# Patient Record
Sex: Female | Born: 1960 | Race: White | Hispanic: No | Marital: Married | Smoking: Never smoker
Health system: Southern US, Community
[De-identification: ages and names within clinical notes are randomized; demographics above are authoritative.]

---

## 2005-11-02 ENCOUNTER — Other Ambulatory Visit: Admission: RE | Admit: 2005-11-02 | Discharge: 2005-11-02 | Payer: Self-pay | Admitting: Family Medicine

## 2006-03-14 ENCOUNTER — Ambulatory Visit (HOSPITAL_COMMUNITY): Admission: RE | Admit: 2006-03-14 | Discharge: 2006-03-14 | Payer: Self-pay | Admitting: Family Medicine

## 2007-01-04 ENCOUNTER — Other Ambulatory Visit: Admission: RE | Admit: 2007-01-04 | Discharge: 2007-01-04 | Payer: Self-pay | Admitting: Family Medicine

## 2007-05-11 ENCOUNTER — Ambulatory Visit (HOSPITAL_COMMUNITY): Admission: RE | Admit: 2007-05-11 | Discharge: 2007-05-11 | Payer: Self-pay | Admitting: Family Medicine

## 2008-05-10 ENCOUNTER — Other Ambulatory Visit: Admission: RE | Admit: 2008-05-10 | Discharge: 2008-05-10 | Payer: Self-pay | Admitting: Family Medicine

## 2008-06-03 ENCOUNTER — Ambulatory Visit (HOSPITAL_COMMUNITY): Admission: RE | Admit: 2008-06-03 | Discharge: 2008-06-03 | Payer: Self-pay | Admitting: Family Medicine

## 2009-07-21 ENCOUNTER — Ambulatory Visit (HOSPITAL_COMMUNITY): Admission: RE | Admit: 2009-07-21 | Discharge: 2009-07-21 | Payer: Self-pay | Admitting: Family Medicine

## 2009-09-05 ENCOUNTER — Other Ambulatory Visit: Admission: RE | Admit: 2009-09-05 | Discharge: 2009-09-05 | Payer: Self-pay | Admitting: Family Medicine

## 2010-07-23 ENCOUNTER — Ambulatory Visit (HOSPITAL_COMMUNITY): Admission: RE | Admit: 2010-07-23 | Discharge: 2010-07-23 | Payer: Self-pay | Admitting: Family Medicine

## 2010-10-02 ENCOUNTER — Other Ambulatory Visit: Admission: RE | Admit: 2010-10-02 | Discharge: 2010-10-02 | Payer: Self-pay | Admitting: Family Medicine

## 2011-07-21 ENCOUNTER — Other Ambulatory Visit (HOSPITAL_COMMUNITY): Payer: Self-pay | Admitting: Family Medicine

## 2011-07-21 DIAGNOSIS — Z1231 Encounter for screening mammogram for malignant neoplasm of breast: Secondary | ICD-10-CM

## 2011-08-03 ENCOUNTER — Ambulatory Visit (HOSPITAL_COMMUNITY)
Admission: RE | Admit: 2011-08-03 | Discharge: 2011-08-03 | Disposition: A | Discharge status: Home / Self Care | Payer: 59 | Source: Ambulatory Visit | Attending: Family Medicine | Admitting: Family Medicine

## 2011-08-03 DIAGNOSIS — Z1231 Encounter for screening mammogram for malignant neoplasm of breast: Secondary | ICD-10-CM

## 2011-08-09 ENCOUNTER — Other Ambulatory Visit: Payer: Self-pay | Admitting: Family Medicine

## 2011-08-09 DIAGNOSIS — R928 Other abnormal and inconclusive findings on diagnostic imaging of breast: Secondary | ICD-10-CM

## 2011-08-16 ENCOUNTER — Other Ambulatory Visit: Payer: Self-pay | Admitting: Family Medicine

## 2011-08-16 ENCOUNTER — Ambulatory Visit
Admission: RE | Admit: 2011-08-16 | Discharge: 2011-08-16 | Disposition: A | Discharge status: Home / Self Care | Payer: 59 | Source: Ambulatory Visit | Attending: Family Medicine | Admitting: Family Medicine

## 2011-08-16 DIAGNOSIS — R928 Other abnormal and inconclusive findings on diagnostic imaging of breast: Secondary | ICD-10-CM

## 2011-10-08 ENCOUNTER — Other Ambulatory Visit: Payer: Self-pay | Admitting: Family Medicine

## 2011-10-08 ENCOUNTER — Other Ambulatory Visit (HOSPITAL_COMMUNITY)
Admission: RE | Admit: 2011-10-08 | Discharge: 2011-10-08 | Disposition: A | Discharge status: Home / Self Care | Payer: 59 | Source: Ambulatory Visit | Attending: Family Medicine | Admitting: Family Medicine

## 2011-10-08 DIAGNOSIS — Z124 Encounter for screening for malignant neoplasm of cervix: Secondary | ICD-10-CM | POA: Insufficient documentation

## 2012-12-06 ENCOUNTER — Other Ambulatory Visit (HOSPITAL_COMMUNITY): Payer: Self-pay | Admitting: Family Medicine

## 2012-12-06 DIAGNOSIS — Z1231 Encounter for screening mammogram for malignant neoplasm of breast: Secondary | ICD-10-CM

## 2012-12-07 ENCOUNTER — Ambulatory Visit (HOSPITAL_COMMUNITY)
Admission: RE | Admit: 2012-12-07 | Discharge: 2012-12-07 | Disposition: A | Discharge status: Home / Self Care | Payer: 59 | Source: Ambulatory Visit | Attending: Family Medicine | Admitting: Family Medicine

## 2012-12-07 DIAGNOSIS — Z1231 Encounter for screening mammogram for malignant neoplasm of breast: Secondary | ICD-10-CM

## 2013-07-13 ENCOUNTER — Encounter: Payer: Self-pay | Admitting: Family Medicine

## 2013-07-13 ENCOUNTER — Ambulatory Visit (INDEPENDENT_AMBULATORY_CARE_PROVIDER_SITE_OTHER): Payer: 59 | Admitting: Family Medicine

## 2013-07-13 VITALS — BP 137/79 | HR 78 | Ht 67.0 in | Wt 160.3 lb

## 2013-07-13 DIAGNOSIS — M545 Low back pain, unspecified: Secondary | ICD-10-CM

## 2013-07-13 NOTE — Patient Instructions (Addendum)
You have a lumbar strain. Take tylenol for baseline pain relief (1-2 extra strength tabs 3x/day) Aleve 2 tabs twice a day with food OR ibuprofen 600mg  three times a day with food for pain and inflammation for next 1-2 weeks then as needed. Consider Tramadol or vicodin as needed for severe pain (no driving on this medicine). Consider flexeril as needed for muscle spasms (no driving on this medicine). Stay as active as possible. Do home exercises and stretches as directed - pick 3-5 of these exercises and do every day for next 4-6 weeks. Consider physical therapy if not improving - call me if you want to do this. Strengthening of low back muscles, abdominal musculature are key for long term pain relief. If not improving, will consider physical therapy or further imaging (x-rays, MRI). Follow up with me in 1 month or as needed.

## 2013-07-18 ENCOUNTER — Encounter: Payer: Self-pay | Admitting: Family Medicine

## 2013-07-18 DIAGNOSIS — M545 Low back pain, unspecified: Secondary | ICD-10-CM | POA: Insufficient documentation

## 2013-07-18 NOTE — Progress Notes (Signed)
Patient ID: Kristina Maddox, female   DOB: April 30, 1961, 52 y.o.   MRN: [id number]  PCP: No primary provider on file.  Subjective:   HPI: Patient is a 52 y.o. female here for low back pain.  Patient reports on 8/20 she was lifting an umbrella stand for work and turned quickly, felt a pop on left side of back. Able to keep working through this though pain intensified throughout the day. Pain worse with certain movements (leaning back, getting out of car). Hard to get out of bed in the morning. Has been icing, using massage chair, aleve. No numbness/tingling. No bowel/bladder dysfunction. No prior injuries.  History reviewed. No pertinent past medical history.  No current outpatient prescriptions on file prior to visit.   No current facility-administered medications on file prior to visit.    Past Surgical History  Procedure Laterality Date  . Cesarean section      Allergies  Allergen Reactions  . Ampicillin     History   Social History  . Marital Status: Married    Spouse Name: N/A    Number of Children: N/A  . Years of Education: N/A   Occupational History  . Not on file.   Social History Main Topics  . Smoking status: Never Smoker   . Smokeless tobacco: Not on file  . Alcohol Use: Not on file  . Drug Use: Not on file  . Sexual Activity: Not on file   Other Topics Concern  . Not on file   Social History Narrative  . No narrative on file    Family History  Problem Relation Age of Onset  . Hypertension Father   . Sudden death Neg Hx   . Hyperlipidemia Neg Hx   . Heart attack Neg Hx   . Diabetes Neg Hx     BP 137/79  Pulse 78  Ht 5\' 7"  (1.702 m)  Wt 160 lb 4.8 oz (72.712 kg)  BMI 25.1 kg/m2  Review of Systems: See HPI above.    Objective:  Physical Exam:  Gen: NAD  Back: No gross deformity, scoliosis. TTP left lumbar paraspinal region mildly.  No midline or bony TTP. FROM with pain on extension > flexion. Strength LEs 5/5 all muscle  groups.   2+ MSRs in patellar and achilles tendons, equal bilaterally. Negative SLRs. Sensation intact to light touch bilaterally. Negative logroll bilateral hips Negative fabers and piriformis stretches.    Assessment & Plan:  1. Low back pain - consistent with lumbar strain, less likely disc herniation without radiculopathy.  Start with tylenol, nsaids, home exercise program.  Declined formal PT - consider if not improving.  F/u in 1 month or prn.

## 2013-07-18 NOTE — Assessment & Plan Note (Signed)
consistent with lumbar strain, less likely disc herniation without radiculopathy.  Start with tylenol, nsaids, home exercise program.  Declined formal PT - consider if not improving.  F/u in 1 month or prn.

## 2014-01-21 ENCOUNTER — Other Ambulatory Visit (HOSPITAL_COMMUNITY): Payer: Self-pay | Admitting: Family Medicine

## 2014-01-21 DIAGNOSIS — Z1231 Encounter for screening mammogram for malignant neoplasm of breast: Secondary | ICD-10-CM

## 2014-01-23 ENCOUNTER — Ambulatory Visit (HOSPITAL_COMMUNITY)
Admission: RE | Admit: 2014-01-23 | Discharge: 2014-01-23 | Disposition: A | Discharge status: Home / Self Care | Payer: 59 | Source: Ambulatory Visit | Attending: Family Medicine | Admitting: Family Medicine

## 2014-01-23 DIAGNOSIS — Z1231 Encounter for screening mammogram for malignant neoplasm of breast: Secondary | ICD-10-CM | POA: Insufficient documentation

## 2014-01-29 ENCOUNTER — Other Ambulatory Visit: Payer: Self-pay | Admitting: Family Medicine

## 2014-01-29 ENCOUNTER — Other Ambulatory Visit (HOSPITAL_COMMUNITY)
Admission: RE | Admit: 2014-01-29 | Discharge: 2014-01-29 | Disposition: A | Discharge status: Home / Self Care | Payer: 59 | Source: Ambulatory Visit | Attending: Family Medicine | Admitting: Family Medicine

## 2014-01-29 DIAGNOSIS — Z124 Encounter for screening for malignant neoplasm of cervix: Secondary | ICD-10-CM | POA: Insufficient documentation

## 2014-01-29 DIAGNOSIS — Z1151 Encounter for screening for human papillomavirus (HPV): Secondary | ICD-10-CM | POA: Insufficient documentation

## 2014-02-07 ENCOUNTER — Other Ambulatory Visit: Payer: Self-pay | Admitting: Obstetrics & Gynecology

## 2015-01-02 ENCOUNTER — Other Ambulatory Visit (HOSPITAL_COMMUNITY): Payer: Self-pay | Admitting: Family Medicine

## 2015-01-02 DIAGNOSIS — Z1231 Encounter for screening mammogram for malignant neoplasm of breast: Secondary | ICD-10-CM

## 2015-01-27 ENCOUNTER — Ambulatory Visit (HOSPITAL_COMMUNITY)
Admission: RE | Admit: 2015-01-27 | Discharge: 2015-01-27 | Disposition: A | Discharge status: Home / Self Care | Payer: 59 | Source: Ambulatory Visit | Attending: Family Medicine | Admitting: Family Medicine

## 2015-01-27 DIAGNOSIS — Z1231 Encounter for screening mammogram for malignant neoplasm of breast: Secondary | ICD-10-CM | POA: Insufficient documentation

## 2016-03-15 ENCOUNTER — Other Ambulatory Visit: Payer: Self-pay

## 2016-03-15 DIAGNOSIS — Z1231 Encounter for screening mammogram for malignant neoplasm of breast: Secondary | ICD-10-CM

## 2016-03-26 ENCOUNTER — Ambulatory Visit
Admission: RE | Admit: 2016-03-26 | Discharge: 2016-03-26 | Disposition: A | Discharge status: Home / Self Care | Payer: 59 | Source: Ambulatory Visit

## 2016-03-26 DIAGNOSIS — Z1231 Encounter for screening mammogram for malignant neoplasm of breast: Secondary | ICD-10-CM

## 2017-02-08 DIAGNOSIS — L719 Rosacea, unspecified: Secondary | ICD-10-CM | POA: Diagnosis not present

## 2017-02-08 DIAGNOSIS — D1800 Hemangioma unspecified site: Secondary | ICD-10-CM | POA: Diagnosis not present

## 2017-02-08 DIAGNOSIS — L821 Other seborrheic keratosis: Secondary | ICD-10-CM | POA: Diagnosis not present

## 2017-02-08 DIAGNOSIS — D485 Neoplasm of uncertain behavior of skin: Secondary | ICD-10-CM | POA: Diagnosis not present

## 2017-03-01 DIAGNOSIS — D2239 Melanocytic nevi of other parts of face: Secondary | ICD-10-CM | POA: Diagnosis not present

## 2017-03-01 DIAGNOSIS — D2330 Other benign neoplasm of skin of unspecified part of face: Secondary | ICD-10-CM | POA: Diagnosis not present

## 2017-03-02 ENCOUNTER — Other Ambulatory Visit: Payer: Self-pay | Admitting: Family Medicine

## 2017-03-02 ENCOUNTER — Other Ambulatory Visit (HOSPITAL_COMMUNITY)
Admission: RE | Admit: 2017-03-02 | Discharge: 2017-03-02 | Disposition: A | Discharge status: Home / Self Care | Payer: 59 | Source: Ambulatory Visit | Attending: Family Medicine | Admitting: Family Medicine

## 2017-03-02 DIAGNOSIS — Z1151 Encounter for screening for human papillomavirus (HPV): Secondary | ICD-10-CM | POA: Diagnosis not present

## 2017-03-02 DIAGNOSIS — Z1159 Encounter for screening for other viral diseases: Secondary | ICD-10-CM | POA: Diagnosis not present

## 2017-03-02 DIAGNOSIS — Z01411 Encounter for gynecological examination (general) (routine) with abnormal findings: Secondary | ICD-10-CM | POA: Insufficient documentation

## 2017-03-02 DIAGNOSIS — Z0000 Encounter for general adult medical examination without abnormal findings: Secondary | ICD-10-CM | POA: Diagnosis not present

## 2017-03-04 LAB — CYTOLOGY - PAP
Diagnosis: NEGATIVE
HPV: NOT DETECTED

## 2017-04-21 ENCOUNTER — Other Ambulatory Visit: Payer: Self-pay | Admitting: Family Medicine

## 2017-04-21 DIAGNOSIS — Z1231 Encounter for screening mammogram for malignant neoplasm of breast: Secondary | ICD-10-CM

## 2017-05-03 ENCOUNTER — Ambulatory Visit
Admission: RE | Admit: 2017-05-03 | Discharge: 2017-05-03 | Disposition: A | Discharge status: Home / Self Care | Payer: 59 | Source: Ambulatory Visit | Attending: Family Medicine | Admitting: Family Medicine

## 2017-05-03 DIAGNOSIS — Z1231 Encounter for screening mammogram for malignant neoplasm of breast: Secondary | ICD-10-CM | POA: Diagnosis not present

## 2017-09-14 DIAGNOSIS — E559 Vitamin D deficiency, unspecified: Secondary | ICD-10-CM | POA: Diagnosis not present

## 2017-09-15 DIAGNOSIS — Z23 Encounter for immunization: Secondary | ICD-10-CM | POA: Diagnosis not present

## 2018-02-07 DIAGNOSIS — L814 Other melanin hyperpigmentation: Secondary | ICD-10-CM | POA: Diagnosis not present

## 2018-02-07 DIAGNOSIS — L821 Other seborrheic keratosis: Secondary | ICD-10-CM | POA: Diagnosis not present

## 2018-02-07 DIAGNOSIS — D1800 Hemangioma unspecified site: Secondary | ICD-10-CM | POA: Diagnosis not present

## 2018-03-06 DIAGNOSIS — B308 Other viral conjunctivitis: Secondary | ICD-10-CM | POA: Diagnosis not present

## 2018-03-21 DIAGNOSIS — Z0000 Encounter for general adult medical examination without abnormal findings: Secondary | ICD-10-CM | POA: Diagnosis not present

## 2018-03-21 DIAGNOSIS — E559 Vitamin D deficiency, unspecified: Secondary | ICD-10-CM | POA: Diagnosis not present

## 2018-04-14 ENCOUNTER — Other Ambulatory Visit: Payer: Self-pay | Admitting: Family Medicine

## 2018-04-14 DIAGNOSIS — Z1231 Encounter for screening mammogram for malignant neoplasm of breast: Secondary | ICD-10-CM

## 2018-05-09 ENCOUNTER — Ambulatory Visit
Admission: RE | Admit: 2018-05-09 | Discharge: 2018-05-09 | Disposition: A | Discharge status: Home / Self Care | Payer: 59 | Source: Ambulatory Visit | Attending: Family Medicine | Admitting: Family Medicine

## 2018-05-09 DIAGNOSIS — Z1231 Encounter for screening mammogram for malignant neoplasm of breast: Secondary | ICD-10-CM | POA: Diagnosis not present

## 2019-04-27 ENCOUNTER — Other Ambulatory Visit: Payer: Self-pay | Admitting: Family Medicine

## 2019-04-27 DIAGNOSIS — Z1231 Encounter for screening mammogram for malignant neoplasm of breast: Secondary | ICD-10-CM

## 2019-06-20 ENCOUNTER — Other Ambulatory Visit: Payer: Self-pay

## 2019-06-20 ENCOUNTER — Ambulatory Visit
Admission: RE | Admit: 2019-06-20 | Discharge: 2019-06-20 | Disposition: A | Discharge status: Home / Self Care | Payer: 59 | Source: Ambulatory Visit | Attending: Family Medicine | Admitting: Family Medicine

## 2019-06-20 DIAGNOSIS — Z1231 Encounter for screening mammogram for malignant neoplasm of breast: Secondary | ICD-10-CM

## 2020-01-28 ENCOUNTER — Ambulatory Visit: Payer: 59 | Attending: Internal Medicine

## 2020-01-28 DIAGNOSIS — Z23 Encounter for immunization: Secondary | ICD-10-CM

## 2020-01-28 NOTE — Progress Notes (Signed)
   Covid-19 Vaccination Clinic  Name:  Kristina Maddox    MRN: [id number] DOB: 10/03/1961  01/28/2020  [name] was observed post Covid-19 immunization for 15 minutes without incident. She was provided with Vaccine Information Sheet and instruction to access the V-Safe system.   [name] was instructed to call 911 with any severe reactions post vaccine: Marland Kitchen Difficulty breathing  . Swelling of face and throat  . A fast heartbeat  . A bad rash all over body  . Dizziness and weakness   Immunizations Administered    Name Date Dose VIS Date Route   Pfizer COVID-19 Vaccine 01/28/2020  6:47 PM 0.3 mL 11/02/2019 Intramuscular   Manufacturer: [place]   Lot: [id number]   [place]: [id number]

## 2020-02-25 ENCOUNTER — Ambulatory Visit: Payer: 59 | Attending: Internal Medicine

## 2020-02-25 DIAGNOSIS — Z23 Encounter for immunization: Secondary | ICD-10-CM

## 2020-02-25 NOTE — Progress Notes (Signed)
   Covid-19 Vaccination Clinic  Name:  ANGIE SHOAF    MRN: [id number] DOB: 11/03/1961  02/25/2020  Ms. Wurtz was observed post Covid-19 immunization for 15 minutes without incident. She was provided with Vaccine Information Sheet and instruction to access the V-Safe system.   Ms. Joshua was instructed to call 911 with any severe reactions post vaccine: Marland Kitchen Difficulty breathing  . Swelling of face and throat  . A fast heartbeat  . A bad rash all over body  . Dizziness and weakness   Immunizations Administered    Name Date Dose VIS Date Route   Pfizer COVID-19 Vaccine 02/25/2020  8:40 AM 0.3 mL 11/02/2019 Intramuscular   Manufacturer: [employer]   Lot: [id number]   [place]: [id number]

## 2020-07-29 ENCOUNTER — Other Ambulatory Visit: Payer: Self-pay | Admitting: Family Medicine

## 2020-07-29 DIAGNOSIS — Z1231 Encounter for screening mammogram for malignant neoplasm of breast: Secondary | ICD-10-CM

## 2020-07-30 ENCOUNTER — Ambulatory Visit
Admission: RE | Admit: 2020-07-30 | Discharge: 2020-07-30 | Disposition: A | Discharge status: Home / Self Care | Payer: 59 | Source: Ambulatory Visit

## 2020-07-30 ENCOUNTER — Other Ambulatory Visit: Payer: Self-pay

## 2020-07-30 DIAGNOSIS — Z1231 Encounter for screening mammogram for malignant neoplasm of breast: Secondary | ICD-10-CM

## 2020-08-04 ENCOUNTER — Other Ambulatory Visit (HOSPITAL_COMMUNITY)
Admission: RE | Admit: 2020-08-04 | Discharge: 2020-08-04 | Disposition: A | Discharge status: Home / Self Care | Payer: 59 | Source: Ambulatory Visit | Attending: Family Medicine | Admitting: Family Medicine

## 2020-08-04 DIAGNOSIS — Z01411 Encounter for gynecological examination (general) (routine) with abnormal findings: Secondary | ICD-10-CM | POA: Diagnosis not present

## 2020-08-07 LAB — CYTOLOGY - PAP: Diagnosis: NEGATIVE

## 2021-08-25 ENCOUNTER — Other Ambulatory Visit: Payer: Self-pay | Admitting: Family Medicine

## 2021-08-25 DIAGNOSIS — Z1231 Encounter for screening mammogram for malignant neoplasm of breast: Secondary | ICD-10-CM

## 2021-08-26 ENCOUNTER — Other Ambulatory Visit: Payer: Self-pay

## 2021-08-26 ENCOUNTER — Ambulatory Visit
Admission: RE | Admit: 2021-08-26 | Discharge: 2021-08-26 | Disposition: A | Discharge status: Home / Self Care | Payer: 59 | Source: Ambulatory Visit

## 2021-08-26 DIAGNOSIS — Z1231 Encounter for screening mammogram for malignant neoplasm of breast: Secondary | ICD-10-CM

## 2022-03-20 IMAGING — MG MM DIGITAL SCREENING BILAT W/ TOMO AND CAD
8 series · 8 of 24 positions shown · non-contrast
Comparison: Previous exam(s).

CLINICAL DATA: Screening.

EXAM:
DIGITAL SCREENING BILATERAL MAMMOGRAM WITH TOMOSYNTHESIS AND CAD
TECHNIQUE: Bilateral screening digital craniocaudal and mediolateral oblique
mammograms were obtained. Bilateral screening digital breast
tomosynthesis was performed. The images were evaluated with
computer-aided detection.

[L CC synth-2D]
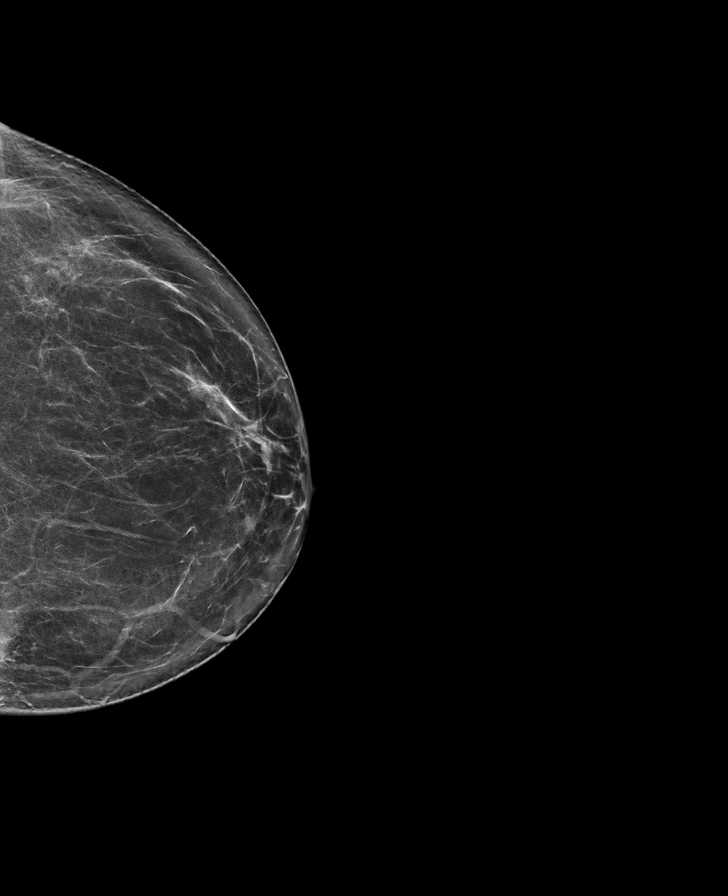

[L MLO synth-2D]
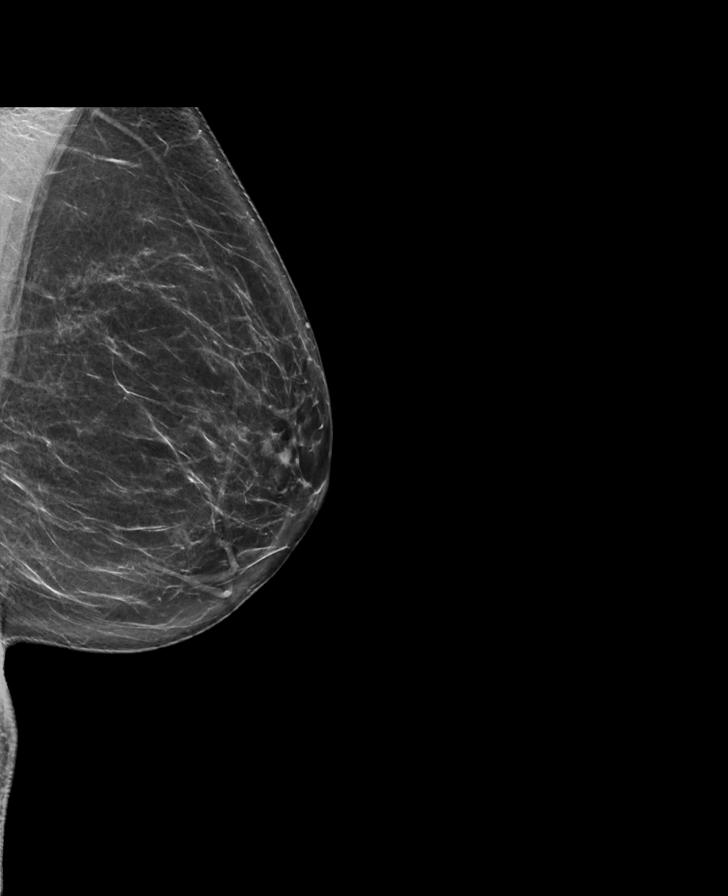

[R CC synth-2D]
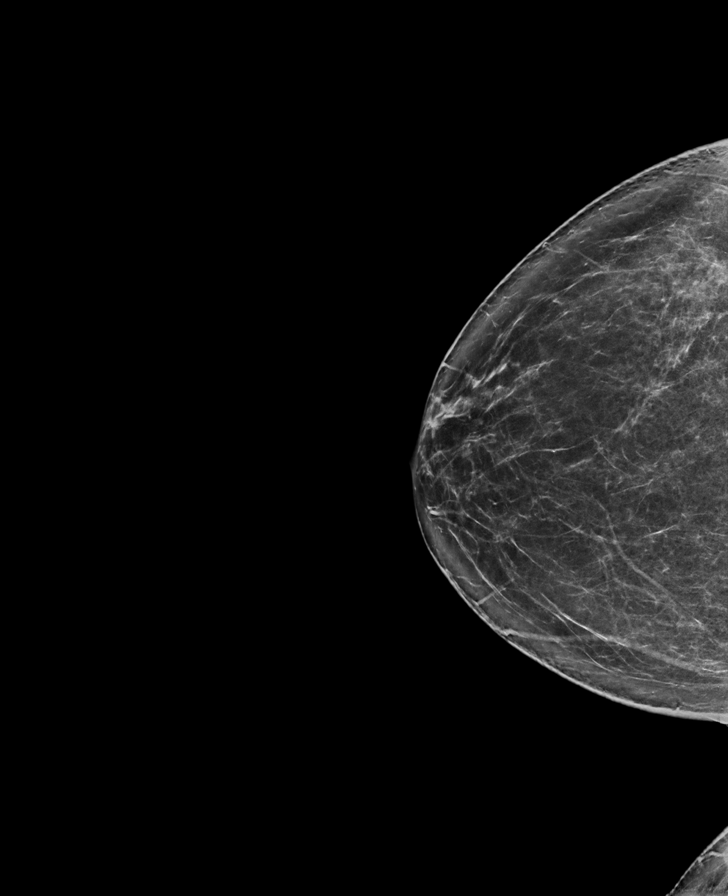

[R MLO synth-2D]
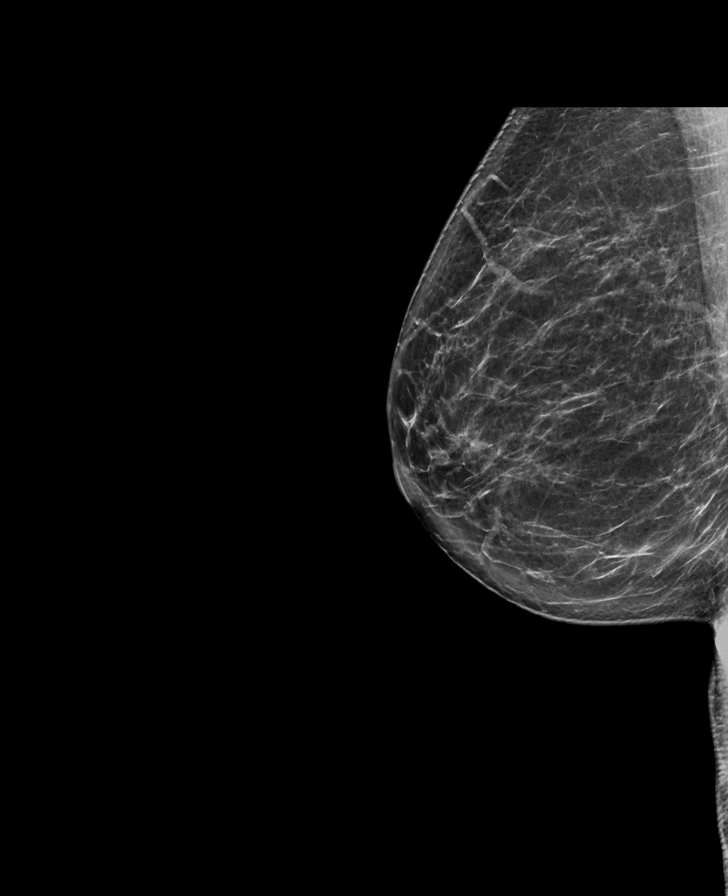

[R MLO tomo · tomo slice 41/82.0]
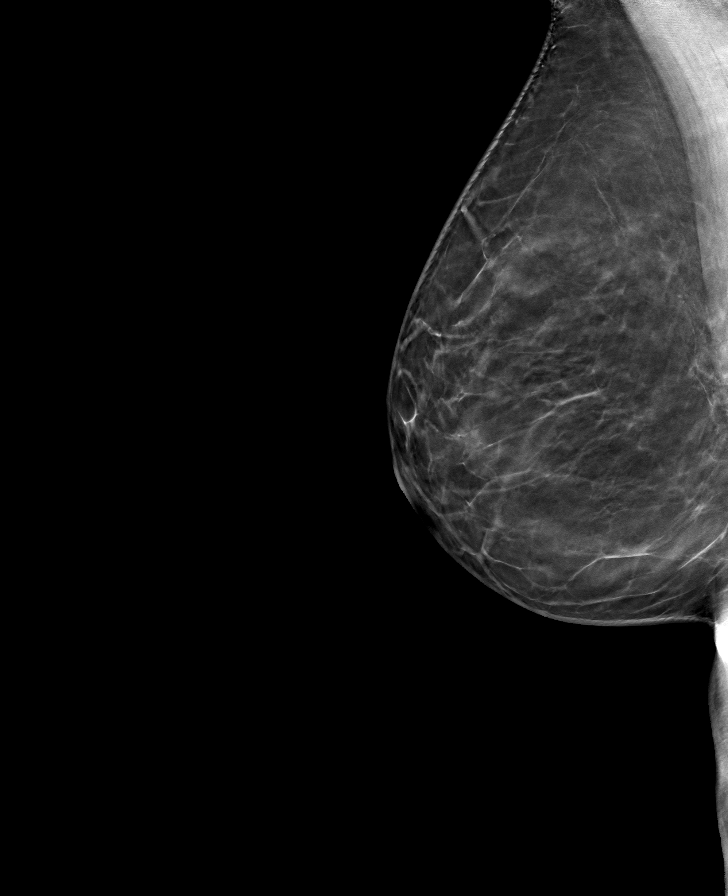

[R CC tomo · tomo slice 41/80.0]
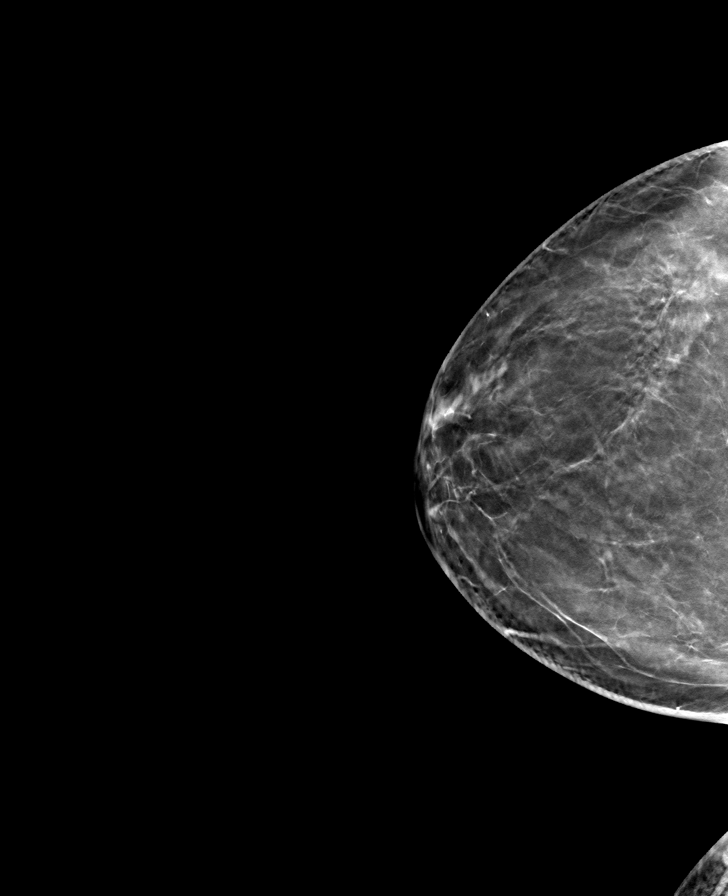

[L CC tomo · tomo slice 40/79.0]
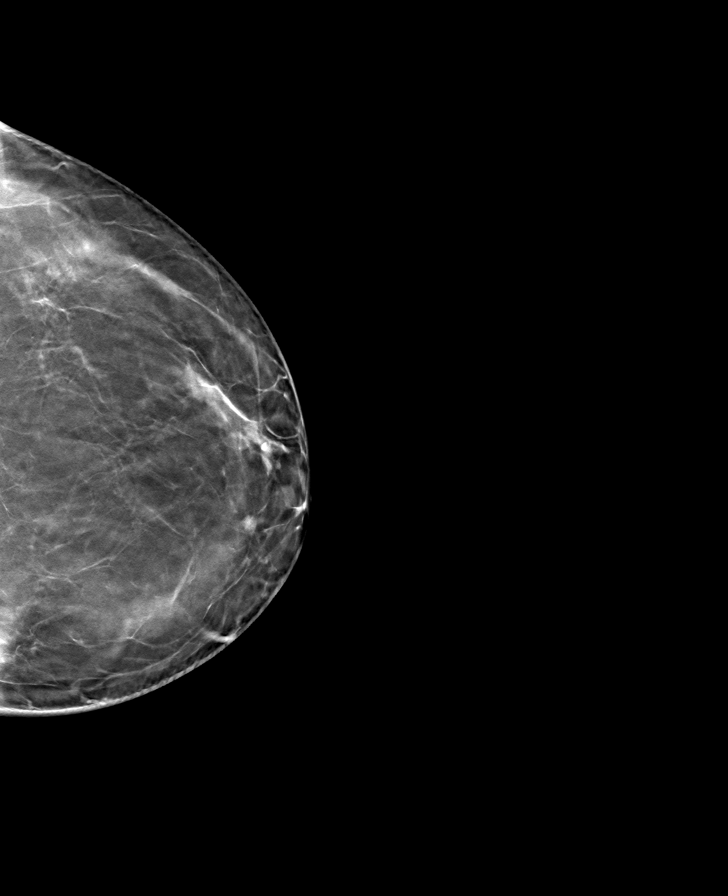

[L MLO tomo · tomo slice 41/81.0]
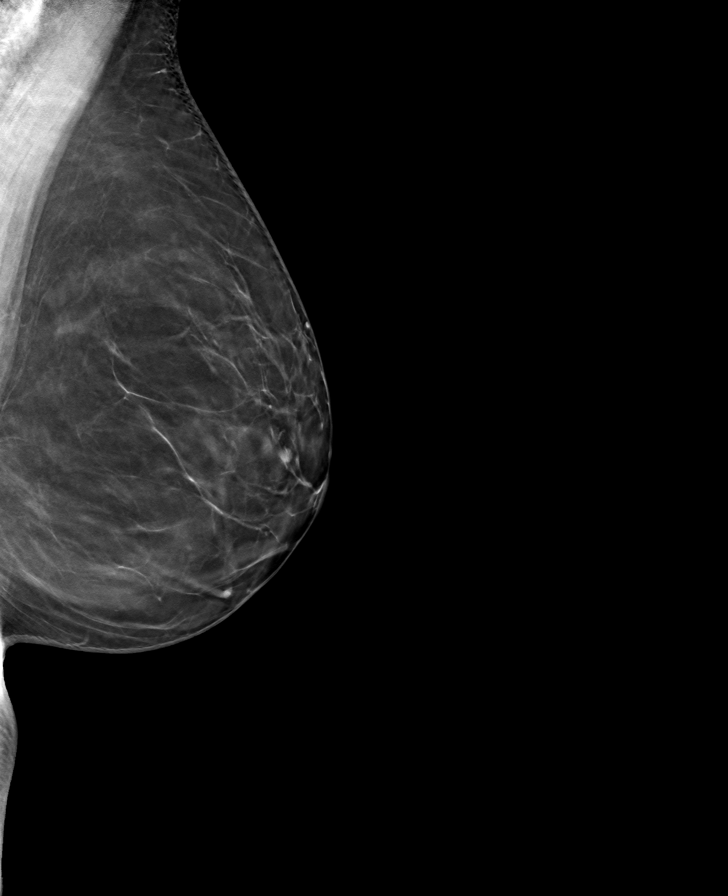

[8 of 24 positions shown; findings below may reference images not displayed]

ACR Breast Density Category b: There are scattered areas of
fibroglandular density.
FINDINGS: There are no findings suspicious for malignancy.
IMPRESSION: No mammographic evidence of malignancy. A result letter of this
screening mammogram will be mailed directly to the patient.

RECOMMENDATION:
Screening mammogram in one year. (Code:51-O-LD2)

BI-RADS CATEGORY  1: Negative.

## 2022-08-16 ENCOUNTER — Other Ambulatory Visit: Payer: Self-pay | Admitting: Family Medicine

## 2022-08-16 DIAGNOSIS — Z1231 Encounter for screening mammogram for malignant neoplasm of breast: Secondary | ICD-10-CM

## 2022-09-16 ENCOUNTER — Ambulatory Visit: Payer: 59

## 2022-11-10 ENCOUNTER — Ambulatory Visit: Payer: 59

## 2022-12-17 ENCOUNTER — Ambulatory Visit
Admission: RE | Admit: 2022-12-17 | Discharge: 2022-12-17 | Disposition: A | Discharge status: Home / Self Care | Payer: 59 | Source: Ambulatory Visit | Attending: Family Medicine | Admitting: Family Medicine

## 2022-12-17 DIAGNOSIS — Z1231 Encounter for screening mammogram for malignant neoplasm of breast: Secondary | ICD-10-CM

## 2023-09-19 LAB — CYTOLOGY - PAP
Comment: NEGATIVE
Diagnosis: NEGATIVE
High risk HPV: NEGATIVE

## 2023-11-17 ENCOUNTER — Other Ambulatory Visit: Payer: Self-pay | Admitting: Family Medicine

## 2023-11-17 DIAGNOSIS — Z1231 Encounter for screening mammogram for malignant neoplasm of breast: Secondary | ICD-10-CM

## 2023-12-20 ENCOUNTER — Ambulatory Visit
Admission: RE | Admit: 2023-12-20 | Discharge: 2023-12-20 | Disposition: A | Discharge status: Home / Self Care | Payer: 59 | Source: Ambulatory Visit

## 2023-12-20 DIAGNOSIS — Z1231 Encounter for screening mammogram for malignant neoplasm of breast: Secondary | ICD-10-CM

## 2024-09-26 ENCOUNTER — Other Ambulatory Visit: Payer: Self-pay | Admitting: Student

## 2024-09-26 DIAGNOSIS — N6323 Unspecified lump in the left breast, lower outer quadrant: Secondary | ICD-10-CM

## 2024-10-11 ENCOUNTER — Ambulatory Visit
Admission: RE | Admit: 2024-10-11 | Discharge: 2024-10-11 | Disposition: A | Source: Ambulatory Visit | Attending: Student

## 2024-10-11 DIAGNOSIS — N6323 Unspecified lump in the left breast, lower outer quadrant: Secondary | ICD-10-CM

## 2024-11-27 ENCOUNTER — Other Ambulatory Visit: Payer: Self-pay | Admitting: Student

## 2024-11-27 DIAGNOSIS — Z1231 Encounter for screening mammogram for malignant neoplasm of breast: Secondary | ICD-10-CM

## 2024-12-13 ENCOUNTER — Other Ambulatory Visit: Payer: Self-pay | Admitting: Medical Genetics

## 2024-12-20 ENCOUNTER — Ambulatory Visit

## 2024-12-27 ENCOUNTER — Other Ambulatory Visit

## 2024-12-27 DIAGNOSIS — Z006 Encounter for examination for normal comparison and control in clinical research program: Secondary | ICD-10-CM

## 2025-01-01 ENCOUNTER — Ambulatory Visit
